# Patient Record
Sex: Female | Born: 1980 | Race: Black or African American | Hispanic: No | Marital: Single | Smoking: Never smoker
Health system: Southern US, Community
[De-identification: ages and names within clinical notes are randomized; demographics above are authoritative.]

## PROBLEM LIST (undated history)

## (undated) DIAGNOSIS — E669 Obesity, unspecified: Secondary | ICD-10-CM

---

## 2011-02-09 ENCOUNTER — Inpatient Hospital Stay (INDEPENDENT_AMBULATORY_CARE_PROVIDER_SITE_OTHER)
Admission: RE | Admit: 2011-02-09 | Discharge: 2011-02-09 | Disposition: A | Discharge status: Home / Self Care | Payer: Medicaid - Out of State | Source: Ambulatory Visit | Attending: Emergency Medicine | Admitting: Emergency Medicine

## 2011-02-09 DIAGNOSIS — N390 Urinary tract infection, site not specified: Secondary | ICD-10-CM

## 2011-02-09 DIAGNOSIS — Z331 Pregnant state, incidental: Secondary | ICD-10-CM

## 2011-02-09 LAB — POCT URINALYSIS DIP (DEVICE)
Glucose, UA: NEGATIVE mg/dL
Nitrite: POSITIVE — AB
Protein, ur: NEGATIVE mg/dL
Specific Gravity, Urine: 1.025 (ref 1.005–1.030)
Urobilinogen, UA: 1 mg/dL (ref 0.0–1.0)

## 2011-02-10 ENCOUNTER — Ambulatory Visit (HOSPITAL_COMMUNITY): Payer: Medicaid - Out of State

## 2011-02-10 ENCOUNTER — Ambulatory Visit (HOSPITAL_COMMUNITY): Payer: Self-pay

## 2016-10-10 ENCOUNTER — Emergency Department (HOSPITAL_COMMUNITY)
Admission: EM | Admit: 2016-10-10 | Discharge: 2016-10-10 | Disposition: A | Discharge status: Home / Self Care | Payer: Medicaid - Out of State | Attending: Emergency Medicine | Admitting: Emergency Medicine

## 2016-10-10 ENCOUNTER — Encounter (HOSPITAL_COMMUNITY): Payer: Self-pay | Admitting: *Deleted

## 2016-10-10 DIAGNOSIS — M25561 Pain in right knee: Secondary | ICD-10-CM | POA: Insufficient documentation

## 2016-10-10 HISTORY — DX: Obesity, unspecified: E66.9

## 2016-10-10 NOTE — Discharge Instructions (Signed)
Please use ibuprofen or naproxen as needed for pain. Use cold and warm compress for 15-20 minutes 3-5 times a day. Follow-up with orthopedic surgery in one week as needed.   Contact a health care provider if: Your knee pain continues, changes, or gets worse. You have a fever along with knee pain. Your knee buckles or locks up. Your knee swells, and the swelling becomes worse. Get help right away if: Your knee feels warm to the touch. You cannot move your knee. You have severe pain in your knee. You have chest pain. You have trouble breathing.

## 2016-10-10 NOTE — ED Provider Notes (Signed)
[facility]-EMERGENCY DEPT Provider Note    By signing my name below, I, Earmon PhoenixJennifer Waddell, attest that this documentation has been prepared under the direction and in the presence of Lorretta HarpFrank Virat Prather, PA-C. Electronically Signed: Earmon PhoenixJennifer Waddell, ED Scribe. 10/10/16. 9:51 AM.    History   Chief Complaint Chief Complaint  Patient presents with  . Knee Pain   The history is provided by the patient and medical records. No language interpreter was used.    Joan Carroll is an obese 36 y.o. female who presents to the Emergency Department complaining of constant, unchanged, severe sharp right knee pain that began two weeks ago. She reports the pain is located anteriorly and posteriorly. She rates the pain as 8/10.  She has taken Ibuprofen for pain with minimal relief but has not taken any today. She denies alleviating factors. She denies fever, chills, nausea, vomiting, numbness, tingling or weakness of the RLE, bruising or wounds. 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 She denies any trauma, injury or fall. She denies right hip or ankle pain. She denies any recent travel or history of DVT or Pe.    Past Medical History:  Diagnosis Date  . Obesity     There are no active problems to display for this patient.   History reviewed. No pertinent surgical history.  OB History    No data available       Home Medications    Prior to Admission medications   Not on File    Family History History reviewed. No pertinent family history.  Social History Social History  Substance Use Topics  . Smoking status: Never Smoker  . Smokeless tobacco: Not on file  . Alcohol use No     Allergies   Patient has no known allergies.   Review of Systems Review of Systems  Constitutional: Negative for chills and fever.  Gastrointestinal: Negative for nausea and vomiting.  Musculoskeletal: Positive for arthralgias.  Negative for joint swelling.  Skin: Negative for color change and wound.  Neurological: Negative for weakness and numbness.     Physical Exam Updated Vital Signs BP 118/78 (BP Location: Right Arm)   Pulse 88   Temp 98.8 F (37.1 C) (Oral)   Resp 16   Ht 5\' 1"  (1.549 m)   Wt 227 lb 8 oz (103.2 kg)   LMP 09/21/2016   SpO2 100%   BMI 42.99 kg/m   Physical Exam  Constitutional: She is oriented to person, place, and time. She appears well-developed and well-nourished.  Well appearing  HENT:  Head: Normocephalic and atraumatic.  Eyes: EOM are normal.  Neck: Normal range of motion.  Cardiovascular: Normal rate and intact distal pulses.   Good distal pulses of the RLE.  Pulmonary/Chest: Effort normal.  Abdominal: Soft.  Musculoskeletal: Normal range of motion.  Full range of motion of right knee. No redness, swelling, deformity noted.   Neurological: She is alert and oriented to person, place, and time.  Sensation intact BLE. Muscle strength 5/5 BLE.  Patient able to stand and ambulate without difficulty.  Skin: Skin is warm and dry.  Psychiatric: She has a normal mood and affect. Her behavior is normal.  Nursing note and vitals reviewed.    ED Treatments / Results  DIAGNOSTIC STUDIES: Oxygen Saturation is 100% on RA, normal by my interpretation.   COORDINATION OF CARE: 9:47 AM- Explained to pt that imaging is not  indicated at this time. Will give referral to orthopedist. Advised pt to apply heat therapy. Offered ace wrap and crutches and pt declined. Pt verbalizes understanding and agrees to plan.  Medications - No data to display  Labs (all labs ordered are listed, but only abnormal results are displayed) Labs Reviewed - No data to display  EKG  EKG Interpretation None       Radiology No results found.  Procedures Procedures (including critical care time)  Medications Ordered in ED Medications - No data to display   Initial Impression / Assessment  and Plan / ED Course  I have reviewed the triage vital signs and the nursing notes.  Pertinent labs & imaging results that were available during my care of the patient were reviewed by me and considered in my medical decision making (see chart for details).     Pt here for right knee pain that began two weeks ago. Imaging not indicated at this time since there was no trauma, injury or fall. Pt advised to follow up with orthopedics. Patient decline ace wrap and crutches while in ED, conservative therapy recommended and discussed. Patient will be discharged home & is agreeable with above plan. Returns precautions discussed. Pt appears safe for discharge.  I personally performed the services described in this documentation, which was scribed in my presence. The recorded information has been reviewed and is accurate.  Final Clinical Impressions(s) / ED Diagnoses   Final diagnoses:  Acute pain of right knee    New Prescriptions New Prescriptions   No medications on file     [address] [place], [place] 10/10/16 1011    Arby Barrette, MD 10/20/16 1001

## 2016-10-10 NOTE — ED Triage Notes (Signed)
Pt reports right knee pain x 2 weeks. Denies injury or swelling to knee. Ambulatory at triage.

## 2016-11-18 ENCOUNTER — Emergency Department (HOSPITAL_COMMUNITY)
Admission: EM | Admit: 2016-11-18 | Discharge: 2016-11-18 | Disposition: A | Discharge status: Home / Self Care | Payer: Medicaid - Out of State | Attending: Dermatology | Admitting: Dermatology

## 2016-11-18 ENCOUNTER — Encounter (HOSPITAL_COMMUNITY): Payer: Self-pay | Admitting: Emergency Medicine

## 2016-11-18 DIAGNOSIS — R079 Chest pain, unspecified: Secondary | ICD-10-CM | POA: Insufficient documentation

## 2016-11-18 DIAGNOSIS — Z5321 Procedure and treatment not carried out due to patient leaving prior to being seen by health care provider: Secondary | ICD-10-CM | POA: Insufficient documentation

## 2016-11-18 NOTE — ED Notes (Signed)
Pt called for vitals recheck x3. No answer.  

## 2016-11-18 NOTE — ED Notes (Signed)
Called for vitals x3. No answer.  

## 2016-11-18 NOTE — ED Triage Notes (Signed)
Pt c/o left sided chest pain that radiates to left arm onset 1 week ago the patient reports feeling weak and jittery.

## 2016-12-08 ENCOUNTER — Emergency Department (HOSPITAL_COMMUNITY): Payer: Self-pay

## 2016-12-08 ENCOUNTER — Encounter (HOSPITAL_COMMUNITY): Payer: Self-pay | Admitting: Emergency Medicine

## 2016-12-08 ENCOUNTER — Emergency Department (HOSPITAL_COMMUNITY)
Admission: EM | Admit: 2016-12-08 | Discharge: 2016-12-08 | Disposition: A | Discharge status: Home / Self Care | Payer: Self-pay | Attending: Emergency Medicine | Admitting: Emergency Medicine

## 2016-12-08 DIAGNOSIS — R0789 Other chest pain: Secondary | ICD-10-CM | POA: Insufficient documentation

## 2016-12-08 LAB — I-STAT CHEM 8, ED
BUN: 6 mg/dL (ref 6–20)
CALCIUM ION: 1.13 mmol/L — AB (ref 1.15–1.40)
CHLORIDE: 104 mmol/L (ref 101–111)
Creatinine, Ser: 0.8 mg/dL (ref 0.44–1.00)
Glucose, Bld: 112 mg/dL — ABNORMAL HIGH (ref 65–99)
HCT: 40 % (ref 36.0–46.0)
HEMOGLOBIN: 13.6 g/dL (ref 12.0–15.0)
POTASSIUM: 3.5 mmol/L (ref 3.5–5.1)
SODIUM: 139 mmol/L (ref 135–145)
TCO2: 24 mmol/L (ref 0–100)

## 2016-12-08 LAB — I-STAT TROPONIN, ED: Troponin i, poc: 0 ng/mL (ref 0.00–0.08)

## 2016-12-08 MED ORDER — IBUPROFEN 800 MG PO TABS: 800.0000 mg | ORAL_TABLET | Freq: Three times a day (TID) | ORAL | 0 refills | Status: DC

## 2016-12-08 MED ORDER — KETOROLAC TROMETHAMINE 60 MG/2ML IM SOLN
60.0000 mg | Freq: Once | INTRAMUSCULAR | Status: AC
  Administered 2016-12-08: 60 mg via INTRAMUSCULAR
  Filled 2016-12-08: qty 2

## 2016-12-08 NOTE — ED Notes (Signed)
Patient transported to X-ray 

## 2016-12-08 NOTE — ED Triage Notes (Signed)
Pt. Stated, I started having chest pain and weakness last night. I was here a week ago for the same.

## 2016-12-08 NOTE — ED Provider Notes (Signed)
[facility]-EMERGENCY DEPT Provider Note   CSN: [id number] Arrival date & time: 12/08/16  0850     History   Chief Complaint Chief Complaint  Patient presents with  . Chest Pain  . Weakness    HPI Joan Carroll is a 36 y.o. female.  The history is provided by the patient. No language interpreter was used.  Chest Pain   Associated symptoms include weakness.  Weakness  Associated symptoms include chest pain.   Joan Carroll is a 36 y.o. female who presents to the Emergency Department complaining of chest pain.  She reports 2 weeks of left upper chest pain. Pain radiates to the left upper arm. It is burning in nature and waxing and waning. Episodes of pain last 5-6 minutes a time. There are no alleviating or worsening factors. She denies any diaphoresis, cough, nausea, vomiting, abdominal pain, leg swelling or pain. She does feel short of breath at times. No known injuries. She has no medical problems and takes no medications. She is a nonsmoker. She has a family history of coronary artery disease her father in 59s. Her mother is deceased from breast cancer.   Past Medical History:  Diagnosis Date  . Obesity     There are no active problems to display for this patient.   Past Surgical History:  Procedure Laterality Date  . CESAREAN SECTION      OB History    No data available       Home Medications    Prior to Admission medications   Medication Sig Start Date End Date Taking? Authorizing Provider  acetaminophen (TYLENOL) 325 MG tablet Take 650 mg by mouth every 6 (six) hours as needed for mild pain.   Yes Historical Provider, MD  ibuprofen (ADVIL,MOTRIN) 800 MG tablet Take 1 tablet (800 mg total) by mouth 3 (three) times daily. 12/08/16   Tilden Fossa, MD    Family History No family history on file.  Social History Social History  Substance Use Topics  . Smoking status: Never Smoker  . Smokeless tobacco: Never Used  . Alcohol use No     Allergies     Patient has no known allergies.   Review of Systems Review of Systems  Cardiovascular: Positive for chest pain.  Neurological: Positive for weakness.  All other systems reviewed and are negative.    Physical Exam Updated Vital Signs BP 114/76   Pulse 67   Temp 99.2 F (37.3 C) (Oral)   Resp 13   Ht  (1.549 m)   Wt 227 lb (103 kg)   LMP 11/27/2016   SpO2 99%   BMI 42.89 kg/m   Physical Exam  Constitutional: She is oriented to person, place, and time. She appears well-developed and well-nourished.  HENT:  Head: Normocephalic and atraumatic.  Cardiovascular: Normal rate and regular rhythm.   No murmur heard. Pulmonary/Chest: Effort normal and breath sounds normal. No respiratory distress.  Tenderness to palpation over the left upper chest wall and left axilla.  Abdominal: Soft. There is no tenderness. There is no rebound and no guarding.  Musculoskeletal: She exhibits no edema or tenderness.  2+ radial pulses bilaterally.  Neurological: She is alert and oriented to person, place, and time.  5 out of 5 strength in bilateral upper extremities with sensation to light touch intact in bilateral upper extremities  Skin: Skin is warm and dry.  Psychiatric: She has a normal mood and affect. Her behavior is normal.  Nursing note and vitals reviewed.  ED Treatments / Results  Labs (all labs ordered are listed, but only abnormal results are displayed) Labs Reviewed  I-STAT CHEM 8, ED - Abnormal; Notable for the following:       Result Value   Glucose, Bld 112 (*)    Calcium, Ion 1.13 (*)    All other components within normal limits  I-STAT TROPOININ, ED    EKG  EKG Interpretation  Date/Time:  Saturday December 08 2016 08:57:28 EDT Ventricular Rate:  71 PR Interval:  166 QRS Duration: 80 QT Interval:  410 QTC Calculation: 445 R Axis:   74 Text Interpretation:  Normal sinus rhythm Normal ECG Confirmed by Lincoln Brigham [phone]) on 12/08/2016 9:06:40 AM        Radiology Dg Chest 2 View  Result Date: 12/08/2016 CLINICAL DATA:  Chest pain EXAM: CHEST  2 VIEW COMPARISON:  None. FINDINGS: Lungs are clear. Heart size and pulmonary vascularity are normal. No adenopathy. No pneumothorax. No bone lesions. IMPRESSION: No edema or consolidation. Electronically Signed   By: Bretta Bang III M.D.   On: 12/08/2016 10:32    Procedures Procedures (including critical care time)  Medications Ordered in ED Medications  ketorolac (TORADOL) injection 60 mg (60 mg Intramuscular Given 12/08/16 1136)     Initial Impression / Assessment and Plan / ED Course  I have reviewed the triage vital signs and the nursing notes.  Pertinent labs & imaging results that were available during my care of the patient were reviewed by me and considered in my medical decision making (see chart for details).     Patient here for evaluation of 2 weeks of left upper chest pain that is reproducible on examination. She is neurovascularly intact on exam. Concern for musculoskeletal chest pain. Presentation is not consistent with ACS, PE, dissection, pneumonia, mass. Counseled pt on home care for muscloskeletal chest pain. Discussed outpatient follow up and return precautions.   Final Clinical Impressions(s) / ED Diagnoses   Final diagnoses:  Chest wall pain    New Prescriptions Discharge Medication List as of 12/08/2016 11:04 AM    START taking these medications   Details  ibuprofen (ADVIL,MOTRIN) 800 MG tablet Take 1 tablet (800 mg total) by mouth 3 (three) times daily., Starting Sat 12/08/2016, Print         Tilden Fossa, MD 12/09/16 [phone]

## 2019-12-21 ENCOUNTER — Encounter (HOSPITAL_COMMUNITY): Payer: Self-pay | Admitting: Emergency Medicine

## 2019-12-21 ENCOUNTER — Other Ambulatory Visit: Payer: Self-pay

## 2019-12-21 ENCOUNTER — Emergency Department (HOSPITAL_COMMUNITY)
Admission: EM | Admit: 2019-12-21 | Discharge: 2019-12-21 | Disposition: A | Discharge status: Home / Self Care | Payer: Medicaid - Out of State | Attending: Emergency Medicine | Admitting: Emergency Medicine

## 2019-12-21 DIAGNOSIS — M79602 Pain in left arm: Secondary | ICD-10-CM

## 2019-12-21 LAB — CBC WITH DIFFERENTIAL/PLATELET
Abs Immature Granulocytes: 0.02 10*3/uL (ref 0.00–0.07)
Basophils Absolute: 0 10*3/uL (ref 0.0–0.1)
Basophils Relative: 0 %
Eosinophils Absolute: 0.1 10*3/uL (ref 0.0–0.5)
Eosinophils Relative: 1 %
HCT: 38.3 % (ref 36.0–46.0)
Hemoglobin: 12.3 g/dL (ref 12.0–15.0)
Immature Granulocytes: 0 %
Lymphocytes Relative: 37 %
Lymphs Abs: 3 10*3/uL (ref 0.7–4.0)
MCH: 29.6 pg (ref 26.0–34.0)
MCHC: 32.1 g/dL (ref 30.0–36.0)
MCV: 92.1 fL (ref 80.0–100.0)
Monocytes Absolute: 0.7 10*3/uL (ref 0.1–1.0)
Monocytes Relative: 9 %
Neutro Abs: 4.3 10*3/uL (ref 1.7–7.7)
Neutrophils Relative %: 53 %
Platelets: 231 10*3/uL (ref 150–400)
RBC: 4.16 MIL/uL (ref 3.87–5.11)
RDW: 12.9 % (ref 11.5–15.5)
WBC: 8.1 10*3/uL (ref 4.0–10.5)
nRBC: 0 % (ref 0.0–0.2)

## 2019-12-21 LAB — COMPREHENSIVE METABOLIC PANEL
ALT: 32 U/L (ref 0–44)
AST: 29 U/L (ref 15–41)
Albumin: 3.5 g/dL (ref 3.5–5.0)
Alkaline Phosphatase: 61 U/L (ref 38–126)
Anion gap: 10 (ref 5–15)
BUN: 12 mg/dL (ref 6–20)
CO2: 23 mmol/L (ref 22–32)
Calcium: 8.9 mg/dL (ref 8.9–10.3)
Chloride: 104 mmol/L (ref 98–111)
Creatinine, Ser: 0.83 mg/dL (ref 0.44–1.00)
GFR calc Af Amer: >60 mL/min (ref >60)
GFR calc non Af Amer: >60 mL/min (ref >60)
Glucose, Bld: 124 mg/dL — ABNORMAL HIGH (ref 70–99)
Potassium: 4 mmol/L (ref 3.5–5.1)
Sodium: 137 mmol/L (ref 135–145)
Total Bilirubin: 0.6 mg/dL (ref 0.3–1.2)
Total Protein: 7.4 g/dL (ref 6.5–8.1)

## 2019-12-21 LAB — TROPONIN I (HIGH SENSITIVITY)
Troponin I (High Sensitivity): <2 ng/L (ref <18)
Troponin I (High Sensitivity): 3 ng/L (ref <18)

## 2019-12-21 MED ORDER — IBUPROFEN 600 MG PO TABS: 600.0000 mg | ORAL_TABLET | Freq: Four times a day (QID) | ORAL | 0 refills | Status: AC | PRN

## 2019-12-21 MED ORDER — IBUPROFEN 200 MG PO TABS
600.0000 mg | ORAL_TABLET | Freq: Once | ORAL | Status: AC
  Administered 2019-12-21: 600 mg via ORAL
  Filled 2019-12-21: qty 1

## 2019-12-21 NOTE — ED Triage Notes (Signed)
Pt c/o left arm pain since yesterday, denies any injury.

## 2019-12-21 NOTE — Discharge Instructions (Signed)
Follow up with a primary care provider from the list provided or with one of your choice for further outpatient management of left arm discomfort.   Return to the emergency department if you develop any new symptoms of concern.

## 2019-12-21 NOTE — ED Provider Notes (Signed)
[facility] EMERGENCY DEPARTMENT Provider Note   CSN: [id number] Arrival date & time: 12/21/19  0236     History Chief Complaint  Patient presents with  . Arm Pain    Joan Carroll is a 39 y.o. female.  Patient to ED for evaluation of pain in her left arm that starts in the superior shoulder and involves the circumferential arm to hand. The pain has been there for "years" but became suddenly worse over the last 2 days. She denies neck pain or numbness of the extremity. 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 She is not dropping objects held with the left hand. She reports she had a brief episode of left sided chest discomfort earlier in the afternoon yesterday but none since. No SOB, nausea. She denies any swelling or discoloration of the left arm. She has not taken anything for the pain.  The history is provided by the patient. No language interpreter was used.  Arm Pain Associated symptoms include chest pain. Pertinent negatives include no abdominal pain and no shortness of breath.       Past Medical History:  Diagnosis Date  . Obesity     There are no problems to display for this patient.   Past Surgical History:  Procedure Laterality Date  . CESAREAN SECTION       OB History   No obstetric history on file.     No family history on file.  Social History   Tobacco Use  . Smoking status: Never Smoker  . Smokeless tobacco: Never Used  Substance Use Topics  . Alcohol use: No  . Drug use: No    Home Medications Prior to Admission medications   Medication Sig Start Date End Date Taking? Authorizing Provider  acetaminophen (TYLENOL) 325 MG tablet Take 650 mg by mouth every 6 (six) hours as needed for mild pain.    [provider]  ibuprofen (ADVIL,MOTRIN) 800 MG tablet Take 1 tablet (800 mg total) by mouth 3 (three) times daily. 12/08/16   Tilden Fossa, MD     Allergies    Patient has no known allergies.  Review of Systems   Review of Systems  Constitutional: Negative for chills and fever.  Respiratory: Negative.  Negative for shortness of breath.   Cardiovascular: Positive for chest pain.  Gastrointestinal: Negative.  Negative for abdominal pain and nausea.  Musculoskeletal: Negative for neck pain.       See HPI.  Skin: Negative.  Negative for color change and wound.  Neurological: Negative.  Negative for weakness and numbness.    Physical Exam Updated Vital Signs BP 125/89 (BP Location: Right Arm)   Pulse 82   Temp 98.8 F (37.1 C) (Oral)   Resp (!) 22   Ht 5\' 1"  (1.549 m)   Wt 103 kg   SpO2 100%   BMI 42.91 kg/m   Physical Exam Vitals and nursing note reviewed.  Constitutional:      Appearance: She is well-developed.  Cardiovascular:     Rate and Rhythm: Normal rate and regular rhythm.     Heart sounds: No murmur.  Pulmonary:     Effort: Pulmonary effort is normal.  Musculoskeletal:     Cervical back: Normal range of motion.     Comments: There is no midline cervical tenderness. She is tender over left trapezius, posterior shoulder, deltoid, generalized arm including axilla, and left chest. No swelling or discoloration. 4/5  grip strength left hand, full strength on bicep/tricep testing that is symmetric with the right. Pulses present and equal bilateral UE's.   Skin:    General: Skin is warm and dry.  Neurological:     Mental Status: She is alert and oriented to person, place, and time.     ED Results / Procedures / Treatments   Labs (all labs ordered are listed, but only abnormal results are displayed) Labs Reviewed  COMPREHENSIVE METABOLIC PANEL - Abnormal; Notable for the following components:      Result Value   Glucose, Bld 124 (*)    All other components within normal limits  CBC WITH DIFFERENTIAL/PLATELET  TROPONIN I (HIGH SENSITIVITY)  TROPONIN I (HIGH SENSITIVITY)   Results for orders placed or  performed during the hospital encounter of 12/21/19  CBC with Differential  Result Value Ref Range   WBC 8.1 4.0 - 10.5 K/uL   RBC 4.16 3.87 - 5.11 MIL/uL   Hemoglobin 12.3 12.0 - 15.0 g/dL   HCT 87.8 67.6 - 72.0 %   MCV 92.1 80.0 - 100.0 fL   MCH 29.6 26.0 - 34.0 pg   MCHC 32.1 30.0 - 36.0 g/dL   RDW 94.7 09.6 - 28.3 %   Platelets 231 150 - 400 K/uL   nRBC 0.0 0.0 - 0.2 %   Neutrophils Relative % 53 %   Neutro Abs 4.3 1.7 - 7.7 K/uL   Lymphocytes Relative 37 %   Lymphs Abs 3.0 0.7 - 4.0 K/uL   Monocytes Relative 9 %   Monocytes Absolute 0.7 0.1 - 1.0 K/uL   Eosinophils Relative 1 %   Eosinophils Absolute 0.1 0.0 - 0.5 K/uL   Basophils Relative 0 %   Basophils Absolute 0.0 0.0 - 0.1 K/uL   Immature Granulocytes 0 %   Abs Immature Granulocytes 0.02 0.00 - 0.07 K/uL  Comprehensive metabolic panel  Result Value Ref Range   Sodium 137 135 - 145 mmol/L   Potassium 4.0 3.5 - 5.1 mmol/L   Chloride 104 98 - 111 mmol/L   CO2 23 22 - 32 mmol/L   Glucose, Bld 124 (H) 70 - 99 mg/dL   BUN 12 6 - 20 mg/dL   Creatinine, Ser 6.62 0.44 - 1.00 mg/dL   Calcium 8.9 8.9 - 94.7 mg/dL   Total Protein 7.4 6.5 - 8.1 g/dL   Albumin 3.5 3.5 - 5.0 g/dL   AST 29 15 - 41 U/L   ALT 32 0 - 44 U/L   Alkaline Phosphatase 61 38 - 126 U/L   Total Bilirubin 0.6 0.3 - 1.2 mg/dL   GFR calc non Af Amer >60 >60 mL/min   GFR calc Af Amer >60 >60 mL/min   Anion gap 10 5 - 15  Troponin I (High Sensitivity)  Result Value Ref Range   Troponin I (High Sensitivity) <2 <18 ng/L    EKG None  Radiology No results found.  Procedures Procedures (including critical care time)  Medications Ordered in ED Medications  ibuprofen (ADVIL) tablet 600 mg (has no administration in time range)    ED Course  I have reviewed the triage vital signs and the nursing notes.  Pertinent labs & imaging results that were available during my care of the patient were reviewed by me and considered in my medical decision making  (see chart for details).    MDM Rules/Calculators/A&P  Patient to ED with worsening of chronic left arm pain.   There is no new weakness, and no numbness. The distribution of the pain does not follow any specific radicular pattern. No evidence of infection or vascular compromise. Chest pain is producible with negative EKG, troponin. Symptoms are not felt to represent any emergent medical condition. Will treat with ibuprofen and refer to primary care provider for outpatient follow up .   Final Clinical Impression(s) / ED Diagnoses Final diagnoses:  None   1. Left arm pain  Rx / DC Orders ED Discharge Orders    None       Charlann Lange, PA-C 12/21/19 1017    Ward, Delice Bison, DO 12/21/19 [phone]

## 2020-06-30 ENCOUNTER — Emergency Department (HOSPITAL_COMMUNITY)
Admission: EM | Admit: 2020-06-30 | Discharge: 2020-06-30 | Disposition: A | Discharge status: Home / Self Care | Payer: Self-pay | Attending: Emergency Medicine | Admitting: Emergency Medicine

## 2020-06-30 ENCOUNTER — Emergency Department (HOSPITAL_COMMUNITY): Payer: Self-pay

## 2020-06-30 ENCOUNTER — Other Ambulatory Visit: Payer: Self-pay

## 2020-06-30 ENCOUNTER — Encounter (HOSPITAL_COMMUNITY): Payer: Self-pay

## 2020-06-30 DIAGNOSIS — R42 Dizziness and giddiness: Secondary | ICD-10-CM | POA: Insufficient documentation

## 2020-06-30 DIAGNOSIS — Z5321 Procedure and treatment not carried out due to patient leaving prior to being seen by health care provider: Secondary | ICD-10-CM | POA: Insufficient documentation

## 2020-06-30 DIAGNOSIS — R079 Chest pain, unspecified: Secondary | ICD-10-CM | POA: Insufficient documentation

## 2020-06-30 LAB — CBC
HCT: 42.3 % (ref 36.0–46.0)
Hemoglobin: 13.7 g/dL (ref 12.0–15.0)
MCH: 29.1 pg (ref 26.0–34.0)
MCHC: 32.4 g/dL (ref 30.0–36.0)
MCV: 89.8 fL (ref 80.0–100.0)
Platelets: 203 10*3/uL (ref 150–400)
RBC: 4.71 MIL/uL (ref 3.87–5.11)
RDW: 13 % (ref 11.5–15.5)
WBC: 5.6 10*3/uL (ref 4.0–10.5)
nRBC: 0 % (ref 0.0–0.2)

## 2020-06-30 LAB — TROPONIN I (HIGH SENSITIVITY): Troponin I (High Sensitivity): <2 ng/L (ref <18)

## 2020-06-30 LAB — I-STAT BETA HCG BLOOD, ED (MC, WL, AP ONLY): I-stat hCG, quantitative: <5 m[IU]/mL (ref <5)

## 2020-06-30 LAB — BASIC METABOLIC PANEL
Anion gap: 12 (ref 5–15)
BUN: 7 mg/dL (ref 6–20)
CO2: 22 mmol/L (ref 22–32)
Calcium: 9.4 mg/dL (ref 8.9–10.3)
Chloride: 104 mmol/L (ref 98–111)
Creatinine, Ser: 0.79 mg/dL (ref 0.44–1.00)
GFR, Estimated: >60 mL/min (ref >60)
Glucose, Bld: 95 mg/dL (ref 70–99)
Potassium: 3.4 mmol/L — ABNORMAL LOW (ref 3.5–5.1)
Sodium: 138 mmol/L (ref 135–145)

## 2020-06-30 NOTE — ED Triage Notes (Signed)
Pt states that she CP that radiates to her L arm for the past several days with some dizziness.

## 2020-06-30 NOTE — ED Notes (Signed)
Pt states they are leaving  

## 2021-08-21 IMAGING — CR DG CHEST 2V
2 series · 2 of 2 positions shown · non-contrast
Comparison: December 08, 2016

CLINICAL DATA: Chest pain and shortness of breath

EXAM:
CHEST - 2 VIEW

[chest pa]
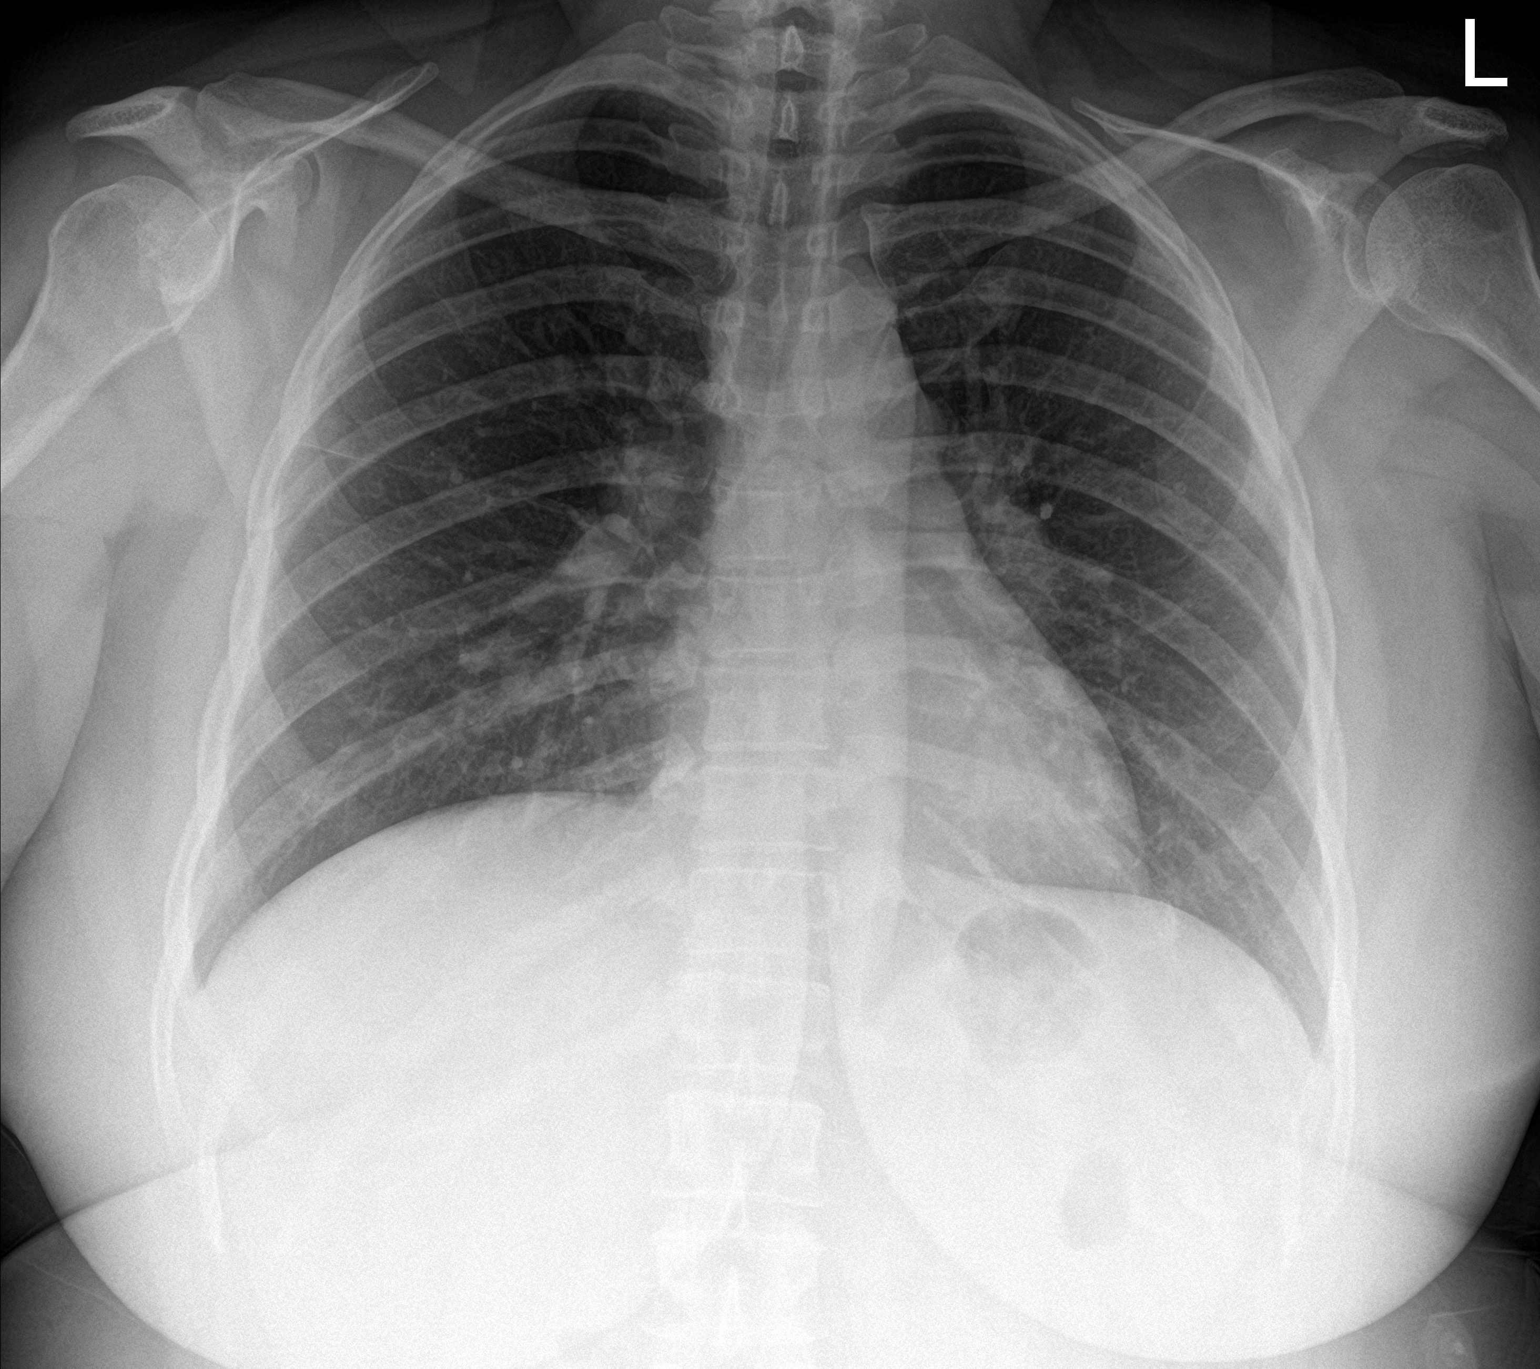

[chest lat]
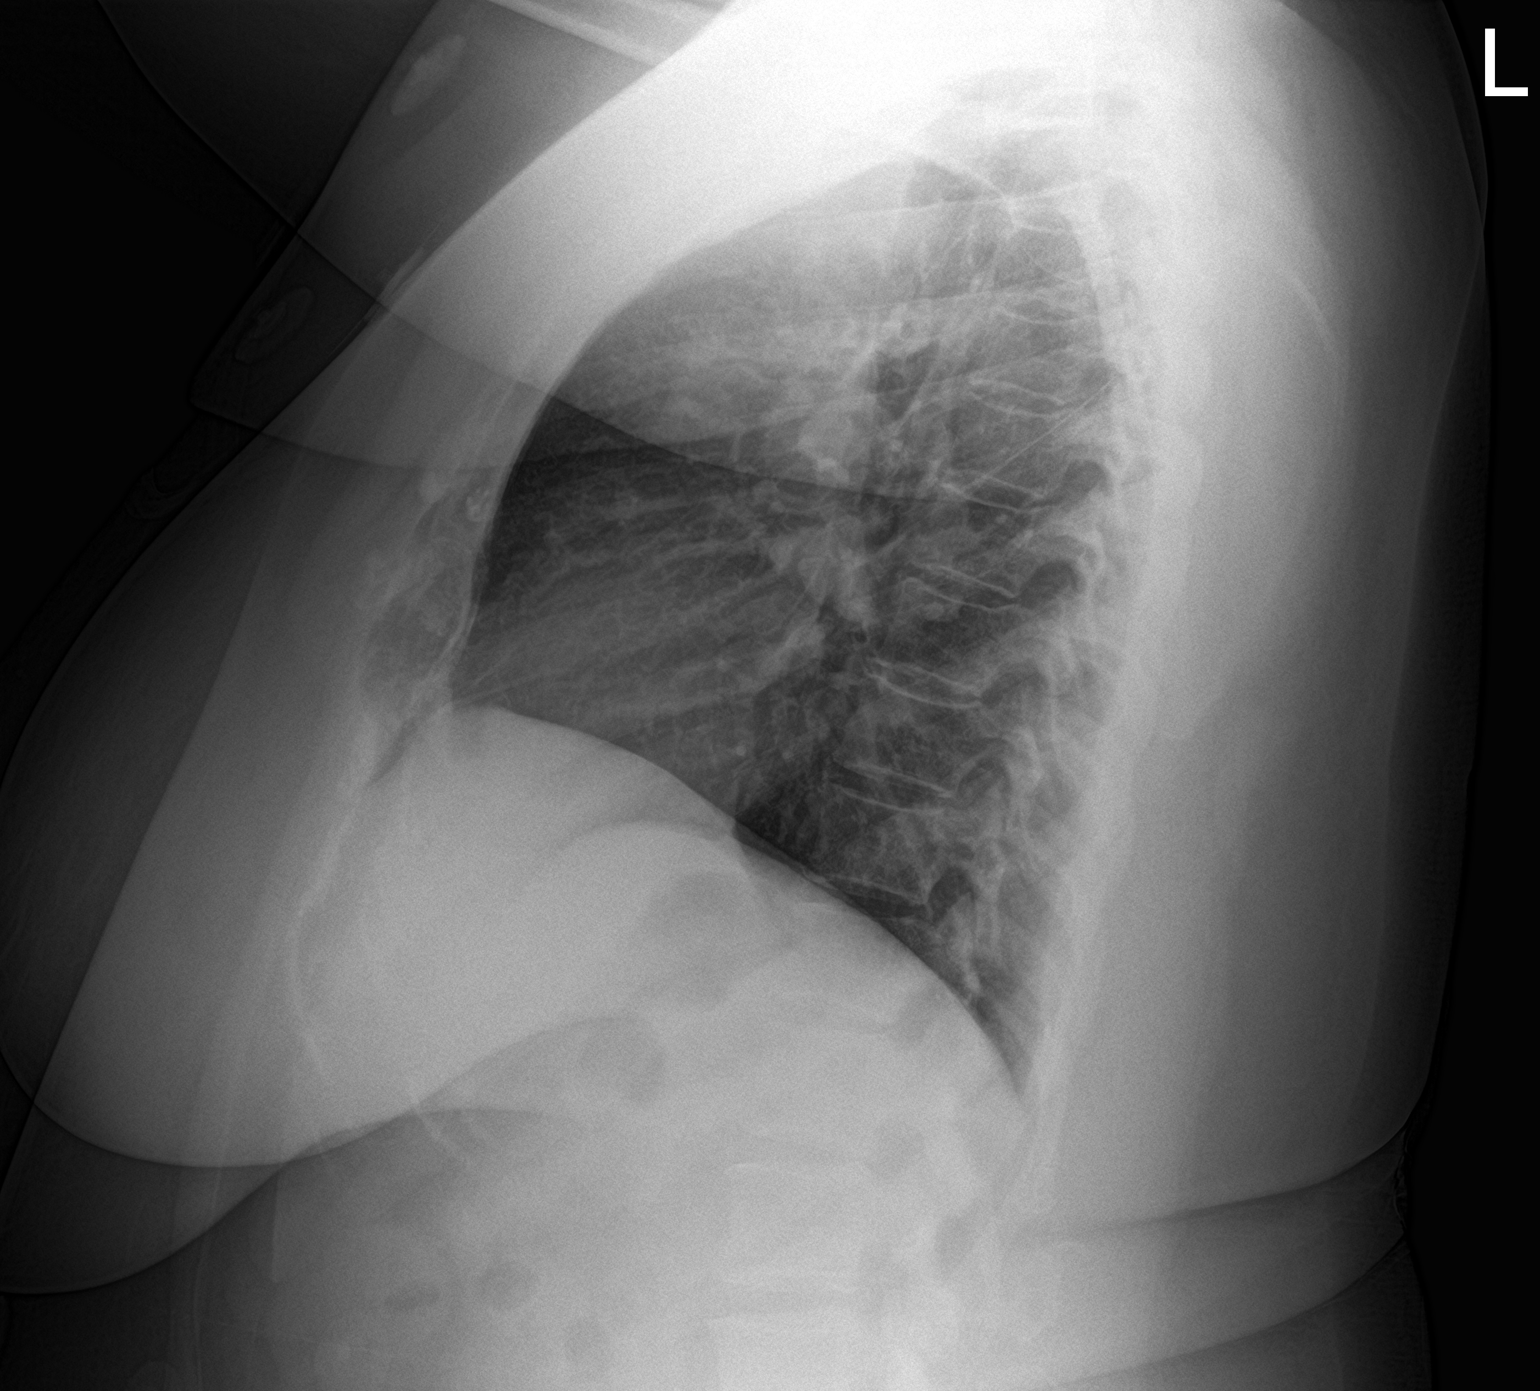

[2 of 2 positions shown; findings below may reference images not displayed]

FINDINGS: The lungs are clear. The heart size and pulmonary vascularity are
normal. No adenopathy. No pneumothorax. No bone lesions.
IMPRESSION: No abnormality noted.
